# Patient Record
Sex: Male | Born: 1985 | Race: White | Hispanic: No | Marital: Married | Smoking: Current every day smoker
Health system: Southern US, Community
[De-identification: ages and names within clinical notes are randomized; demographics above are authoritative.]

## PROBLEM LIST (undated history)

## (undated) DIAGNOSIS — Z720 Tobacco use: Secondary | ICD-10-CM

## (undated) HISTORY — PX: NO PAST SURGERIES: SHX2092

---

## 2015-08-05 ENCOUNTER — Inpatient Hospital Stay (HOSPITAL_COMMUNITY)
Admission: EM | Admit: 2015-08-05 | Discharge: 2015-08-07 | DRG: 419 | Disposition: A | Discharge status: Home / Self Care | Payer: Medicaid Other | Attending: General Surgery | Admitting: General Surgery

## 2015-08-05 ENCOUNTER — Emergency Department (HOSPITAL_COMMUNITY): Payer: Medicaid Other

## 2015-08-05 ENCOUNTER — Encounter (HOSPITAL_COMMUNITY): Payer: Self-pay

## 2015-08-05 DIAGNOSIS — K8010 Calculus of gallbladder with chronic cholecystitis without obstruction: Secondary | ICD-10-CM | POA: Diagnosis present

## 2015-08-05 DIAGNOSIS — Z720 Tobacco use: Secondary | ICD-10-CM

## 2015-08-05 DIAGNOSIS — F17210 Nicotine dependence, cigarettes, uncomplicated: Secondary | ICD-10-CM | POA: Diagnosis present

## 2015-08-05 DIAGNOSIS — R1011 Right upper quadrant pain: Secondary | ICD-10-CM | POA: Diagnosis present

## 2015-08-05 HISTORY — DX: Tobacco use: Z72.0

## 2015-08-05 LAB — LIPASE, BLOOD: LIPASE: 29 U/L (ref 22–51)

## 2015-08-05 LAB — CBC
HEMATOCRIT: 45.8 % (ref 39.0–52.0)
HEMATOCRIT: 45 % (ref 39.0–52.0)
HEMOGLOBIN: 15.6 g/dL (ref 13.0–17.0)
Hemoglobin: 15.4 g/dL (ref 13.0–17.0)
MCH: 27.8 pg (ref 26.0–34.0)
MCH: 27.8 pg (ref 26.0–34.0)
MCHC: 34.1 g/dL (ref 30.0–36.0)
MCHC: 34.2 g/dL (ref 30.0–36.0)
MCV: 81.6 fL (ref 78.0–100.0)
MCV: 81.2 fL (ref 78.0–100.0)
Platelets: 262 10*3/uL (ref 150–400)
Platelets: 262 10*3/uL (ref 150–400)
RBC: 5.61 MIL/uL (ref 4.22–5.81)
RBC: 5.54 MIL/uL (ref 4.22–5.81)
RDW: 13.9 % (ref 11.5–15.5)
RDW: 13.8 % (ref 11.5–15.5)
WBC: 11.5 10*3/uL — ABNORMAL HIGH (ref 4.0–10.5)
WBC: 13.7 10*3/uL — ABNORMAL HIGH (ref 4.0–10.5)

## 2015-08-05 LAB — COMPREHENSIVE METABOLIC PANEL
ALBUMIN: 4 g/dL (ref 3.5–5.0)
ALK PHOS: 71 U/L (ref 38–126)
ALT: 81 U/L — ABNORMAL HIGH (ref 17–63)
ANION GAP: 9 (ref 5–15)
AST: 35 U/L (ref 15–41)
BUN: 10 mg/dL (ref 6–20)
CALCIUM: 9.4 mg/dL (ref 8.9–10.3)
CHLORIDE: 103 mmol/L (ref 101–111)
CO2: 28 mmol/L (ref 22–32)
Creatinine, Ser: 1.1 mg/dL (ref 0.61–1.24)
GFR calc non Af Amer: >60 mL/min (ref >60)
GLUCOSE: 127 mg/dL — AB (ref 65–99)
POTASSIUM: 3.7 mmol/L (ref 3.5–5.1)
SODIUM: 140 mmol/L (ref 135–145)
Total Bilirubin: 0.3 mg/dL (ref 0.3–1.2)
Total Protein: 7.1 g/dL (ref 6.5–8.1)

## 2015-08-05 LAB — URINALYSIS, ROUTINE W REFLEX MICROSCOPIC
Bilirubin Urine: NEGATIVE
GLUCOSE, UA: NEGATIVE mg/dL
Hgb urine dipstick: NEGATIVE
Ketones, ur: NEGATIVE mg/dL
LEUKOCYTES UA: NEGATIVE
Nitrite: NEGATIVE
PROTEIN: NEGATIVE mg/dL
SPECIFIC GRAVITY, URINE: 1.019 (ref 1.005–1.030)
Urobilinogen, UA: 0.2 mg/dL (ref 0.0–1.0)
pH: 6.5 (ref 5.0–8.0)

## 2015-08-05 LAB — CREATININE, SERUM
Creatinine, Ser: 0.94 mg/dL (ref 0.61–1.24)
GFR calc Af Amer: >60 mL/min (ref >60)

## 2015-08-05 LAB — SURGICAL PCR SCREEN
MRSA, PCR: NEGATIVE
Staphylococcus aureus: NEGATIVE

## 2015-08-05 MED ORDER — ACETAMINOPHEN 325 MG PO TABS: 650.0000 mg | ORAL_TABLET | Freq: Four times a day (QID) | ORAL | Status: DC | PRN

## 2015-08-05 MED ORDER — ONDANSETRON HCL 4 MG/2ML IJ SOLN
4.0000 mg | Freq: Once | INTRAMUSCULAR | Status: AC
  Administered 2015-08-05: 4 mg via INTRAVENOUS
  Filled 2015-08-05: qty 2

## 2015-08-05 MED ORDER — OXYCODONE-ACETAMINOPHEN 5-325 MG PO TABS: 1.0000 | ORAL_TABLET | ORAL | Status: DC | PRN

## 2015-08-05 MED ORDER — HYDROMORPHONE HCL 1 MG/ML IJ SOLN
1.0000 mg | INTRAMUSCULAR | Status: DC | PRN
  Administered 2015-08-06: 1 mg via INTRAVENOUS
  Filled 2015-08-05: qty 1

## 2015-08-05 MED ORDER — ONDANSETRON 4 MG PO TBDP: 4.0000 mg | ORAL_TABLET | Freq: Four times a day (QID) | ORAL | Status: DC | PRN

## 2015-08-05 MED ORDER — HYDROMORPHONE HCL 1 MG/ML IJ SOLN
1.0000 mg | Freq: Once | INTRAMUSCULAR | Status: AC
  Administered 2015-08-05: 1 mg via INTRAVENOUS
  Filled 2015-08-05: qty 1

## 2015-08-05 MED ORDER — PNEUMOCOCCAL VAC POLYVALENT 25 MCG/0.5ML IJ INJ
0.5000 mL | INJECTION | INTRAMUSCULAR | Status: AC
Start: 2015-08-06 — End: 2015-08-07
  Administered 2015-08-07: 0.5 mL via INTRAMUSCULAR

## 2015-08-05 MED ORDER — IBUPROFEN 600 MG PO TABS: 600.0000 mg | ORAL_TABLET | Freq: Four times a day (QID) | ORAL | Status: DC | PRN

## 2015-08-05 MED ORDER — DEXTROSE 5 % IV SOLN
2.0000 g | INTRAVENOUS | Status: DC
  Administered 2015-08-05 – 2015-08-06 (×2): 2 g via INTRAVENOUS
  Filled 2015-08-05 (×3): qty 2

## 2015-08-05 MED ORDER — KCL IN DEXTROSE-NACL 20-5-0.9 MEQ/L-%-% IV SOLN
INTRAVENOUS | Status: DC
  Administered 2015-08-05 – 2015-08-06 (×3): via INTRAVENOUS
  Filled 2015-08-05 (×7): qty 1000

## 2015-08-05 MED ORDER — ACETAMINOPHEN 650 MG RE SUPP: 650.0000 mg | Freq: Four times a day (QID) | RECTAL | Status: DC | PRN

## 2015-08-05 MED ORDER — HEPARIN SODIUM (PORCINE) 5000 UNIT/ML IJ SOLN
5000.0000 [IU] | Freq: Three times a day (TID) | INTRAMUSCULAR | Status: AC
  Administered 2015-08-05 (×2): 5000 [IU] via SUBCUTANEOUS
  Filled 2015-08-05 (×2): qty 1

## 2015-08-05 MED ORDER — NICOTINE 14 MG/24HR TD PT24
14.0000 mg | MEDICATED_PATCH | Freq: Every day | TRANSDERMAL | Status: DC
  Administered 2015-08-05 – 2015-08-06 (×2): 14 mg via TRANSDERMAL
  Filled 2015-08-05 (×3): qty 1

## 2015-08-05 MED ORDER — ONDANSETRON HCL 4 MG/2ML IJ SOLN
4.0000 mg | Freq: Four times a day (QID) | INTRAMUSCULAR | Status: DC | PRN
  Administered 2015-08-06: 4 mg via INTRAVENOUS
  Filled 2015-08-05: qty 2

## 2015-08-05 NOTE — ED Notes (Signed)
Pt remains monitored by blood pressure and pulse ox. Pt encourage to provide urine specimen. Pts family remains at bedside.

## 2015-08-05 NOTE — ED Notes (Signed)
Pt stated he only had a tee shirt and family in room stated they had taken it to the car already. No other belongings

## 2015-08-05 NOTE — Progress Notes (Signed)
Pt admitted to 6N32 via stretcher from ED.  Pt AAO X4.  Pt on RA.  Pt has 20G to Rt AC SL.  Report rcvd from [place], [place].  Family and belongings to bedside.  Pt has no questions at the moment.  Will continue to monitor.

## 2015-08-05 NOTE — ED Notes (Signed)
Intermittent upper right abd pain x 1 year, pain increasingly worse this evening. Pt vomited 2x and complains of shoulder pain as well.

## 2015-08-05 NOTE — ED Notes (Signed)
Pt remains monitored by blood pressure and pulse ox. Pts family remains at bedside.  

## 2015-08-05 NOTE — ED Provider Notes (Signed)
CSN: [id number]     Arrival date & time 08/05/15  0654 History   First MD Initiated Contact with Patient 08/05/15 [phone]     Chief Complaint  Patient presents with  . Abdominal Pain     (Consider location/radiation/quality/duration/timing/severity/associated sxs/prior Treatment) HPI Comments: Patient presents with complaint of right upper quadrant abdominal pain with associated nausea and vomiting. Patient has had this pain intermittently for the past 1 year. He has never had it evaluated. Pain radiates to his right shoulder. The current episode of pain started approximately 2 AM. Symptoms awoke patient from sleep. Patient had a cheese steak sub yesterday afternoon and a deli sub an approximate 12:30 AM. Symptoms were intermittent but became constant at approximately 5 AM. Patient has had 2 episodes of vomiting. No diarrhea. No previous abdominal surgeries. No urinary symptoms. No new or worsening diarrhea or blood in stool. Patient denies heavy NSAID or alcohol use. He is a smoker. The onset of this condition was acute. The course is constant. Aggravating factors: none. Alleviating factors: none.    Patient is a 29 y.o. male presenting with abdominal pain. The history is provided by the patient.  Abdominal Pain Associated symptoms: nausea and vomiting   Associated symptoms: no chest pain, no cough, no diarrhea, no dysuria, no fever and no sore throat     No past medical history on file. No past surgical history on file. No family history on file. Social History  Substance Use Topics  . Smoking status: Current Every Day Smoker -- 1.00 packs/day    Types: Cigarettes  . Smokeless tobacco: Never Used  . Alcohol Use: No    Review of Systems  Constitutional: Negative for fever.  HENT: Negative for rhinorrhea and sore throat.   Eyes: Negative for redness.  Respiratory: Negative for cough.   Cardiovascular: Negative for chest pain.  Gastrointestinal: Positive for nausea, vomiting and  abdominal pain. Negative for diarrhea and blood in stool.  Genitourinary: Negative for dysuria.  Musculoskeletal: Negative for myalgias.  Skin: Negative for rash.  Neurological: Negative for headaches.      Allergies  Review of patient's allergies indicates no known allergies.  Home Medications   Prior to Admission medications   Not on File   BP 162/76 mmHg  Pulse 82  Temp(Src) 98.3 F (36.8 C) (Oral)  Resp 20  SpO2 99%   Physical Exam  Constitutional: He appears well-developed and well-nourished.  HENT:  Head: Normocephalic and atraumatic.  Eyes: Conjunctivae are normal. Right eye exhibits no discharge. Left eye exhibits no discharge.  Neck: Normal range of motion. Neck supple.  Cardiovascular: Normal rate, regular rhythm and normal heart sounds.   Pulmonary/Chest: Effort normal and breath sounds normal.  Abdominal: Soft. Bowel sounds are normal. There is tenderness in the right upper quadrant. There is positive Murphy's sign. There is no rigidity, no rebound, no guarding and no tenderness at McBurney's point.  Neurological: He is alert.  Skin: Skin is warm and dry.  Psychiatric: He has a normal mood and affect.  Nursing note and vitals reviewed.   ED Course  Procedures (including critical care time) Labs Review Labs Reviewed  COMPREHENSIVE METABOLIC PANEL - Abnormal; Notable for the following:    Glucose, Bld 127 (*)    ALT 81 (*)    All other components within normal limits  CBC - Abnormal; Notable for the following:    WBC 11.5 (*)    All other components within normal limits  CBC - Abnormal; Notable for  the following:    WBC 13.7 (*)    All other components within normal limits  LIPASE, BLOOD  URINALYSIS, ROUTINE W REFLEX MICROSCOPIC (NOT AT [facility])  CREATININE, SERUM    Imaging Review US Abdomen Complete  08/05/2015   CLINICAL DATA:  29 year old male with right abdominal pain for 1 day.  EXAM: ULTRASOUND ABDOMEN COMPLETE  COMPARISON:  None.  FINDINGS:  Gallbladder: A 2 cm non mobile gallstone at the gallbladder neck is identified. Gallbladder wall is upper limits of normal measuring up to 3 mm in diameter. There is no evidence of pericholecystic fluid or sonographic Murphy sign.  Common bile duct: Diameter: 5.8 mm. There is no evidence of intrahepatic or extrahepatic biliary dilatation.  Liver: Increased echogenicity of the liver is compatible with hepatic steatosis. No other hepatic abnormalities are identified.  IVC: No abnormality visualized.  Pancreas: Visualized portion unremarkable.  Spleen: Size and appearance within normal limits.  Right Kidney: Length: 12.9 cm. Echogenicity within normal limits. No mass or hydronephrosis visualized.  Left Kidney: Length: 12.8 cm. Echogenicity within normal limits. No mass or hydronephrosis visualized.  Abdominal aorta: No aneurysm visualized.  Other findings: No free fluid.  IMPRESSION: 2 cm nonmobile gallstone at the gallbladder neck with upper limits of normal gallbladder wall thickness which may represent early/developing acute cholecystitis. However, no evidence of pericholecystic fluid or sonographic Murphy sign.  Hepatic steatosis.   Electronically Signed   By: Harmon Pier M.D.   On: 08/05/2015 11:20   I have personally reviewed and evaluated these images and lab results as part of my medical decision-making.   EKG Interpretation None      7:31 AM Patient seen and examined. Work-up initiated. Medications ordered.   Vital signs reviewed and are as follows: BP 162/76 mmHg  Pulse 82  Temp(Src) 98.3 F (36.8 C) (Oral)  Resp 20  SpO2 99%   11:31 AM [place] completed. Findings as above. Pain was improved, now returning. Will consult surgery.   Surgery has seen, they will admit.   MDM   Final diagnosis: cholithiasis  Admit.    Renne Crigler, PA-C 08/05/15 1401  Alvira Monday, MD 08/06/15 [phone]

## 2015-08-05 NOTE — ED Notes (Signed)
Patient transported to Ultrasound 

## 2015-08-05 NOTE — H&P (Signed)
Garrett Serrano is an 29 y.o. male.   Chief Complaint: Pain RUQ going to shoulder; with nausea and vomiting  HPI: 56 y/0 male with episodes of pain RUQ, on and off for about a year.  Pain goes to right shoulder.  He has steak and cheese last PM, and woke up with pain around 2 AM and 2 episodes of nausea and vomiting. He had pain till about 8 AM.  He has had multiple prior episodes, but they only last about 2 hours.  This has lasted longer than the others, he did show some improvement earlier this AM, but pain returned. He presented to the ED early this AM.   He was afebrile, BP/HR are normal.  Labs show slight bump in ALT 81, lipase is normal, WBC 11.5.  Abdominal ultrasound shows:  2 cm nonmobile gallstone at the gallbladder neck with upper limits of normal gallbladder wall thickness which may represent early/developing acute cholecystitis. However, no evidence of pericholecystic fluid or sonographic Murphy sign.  We are ask to see.  Past Medical History  Diagnosis Date  . Tobacco use     No past surgical history on file.  No prior surgery  No family history on file. Social History:  reports that he has been smoking Cigarettes.  He has been smoking about 1.00 pack per day. He has never used smokeless tobacco. He reports that he does not drink alcohol or use illicit drugs.  Tobacco:  1 PPD ETOH:  None DRUGS:  none Married  Works at [employer].  Allergies: No Known Allergies  Prior to Admission medications   None      Results for orders placed or performed during the hospital encounter of 08/05/15 (from the past 48 hour(s))  Lipase, blood     Status: None   Collection Time: 08/05/15  7:10 AM  Result Value Ref Range   Lipase 29 22 - 51 U/L  Comprehensive metabolic panel     Status: Abnormal   Collection Time: 08/05/15  7:10 AM  Result Value Ref Range   Sodium 140 135 - 145 mmol/L   Potassium 3.7 3.5 - 5.1 mmol/L   Chloride 103 101 - 111 mmol/L   CO2 28 22 - 32 mmol/L   Glucose, Bld 127 (H) 65 - 99 mg/dL   BUN 10 6 - 20 mg/dL   Creatinine, Ser 1.10 0.61 - 1.24 mg/dL   Calcium 9.4 8.9 - 10.3 mg/dL   Total Protein 7.1 6.5 - 8.1 g/dL   Albumin 4.0 3.5 - 5.0 g/dL   AST 35 15 - 41 U/L   ALT 81 (H) 17 - 63 U/L   Alkaline Phosphatase 71 38 - 126 U/L   Total Bilirubin 0.3 0.3 - 1.2 mg/dL   GFR calc non Af Amer >60 >60 mL/min   GFR calc Af Amer >60 >60 mL/min    Comment: (NOTE) The eGFR has been calculated using the CKD EPI equation. This calculation has not been validated in all clinical situations. eGFR's persistently <60 mL/min signify possible Chronic Kidney Disease.    Anion gap 9 5 - 15  CBC     Status: Abnormal   Collection Time: 08/05/15  7:10 AM  Result Value Ref Range   WBC 11.5 (H) 4.0 - 10.5 K/uL   RBC 5.61 4.22 - 5.81 MIL/uL   Hemoglobin 15.6 13.0 - 17.0 g/dL   HCT 45.8 39.0 - 52.0 %   MCV 81.6 78.0 - 100.0 fL   MCH 27.8  26.0 - 34.0 pg   MCHC 34.1 30.0 - 36.0 g/dL   RDW 13.9 11.5 - 15.5 %   Platelets 262 150 - 400 K/uL  Urinalysis, Routine w reflex microscopic (not at [facility])     Status: None   Collection Time: 08/05/15  8:05 AM  Result Value Ref Range   Color, Urine YELLOW YELLOW   APPearance CLEAR CLEAR   Specific Gravity, Urine 1.019 1.005 - 1.030   pH 6.5 5.0 - 8.0   Glucose, UA NEGATIVE NEGATIVE mg/dL   Hgb urine dipstick NEGATIVE NEGATIVE   Bilirubin Urine NEGATIVE NEGATIVE   Ketones, ur NEGATIVE NEGATIVE mg/dL   Protein, ur NEGATIVE NEGATIVE mg/dL   Urobilinogen, UA 0.2 0.0 - 1.0 mg/dL   Nitrite NEGATIVE NEGATIVE   Leukocytes, UA NEGATIVE NEGATIVE    Comment: MICROSCOPIC NOT DONE ON URINES WITH NEGATIVE PROTEIN, BLOOD, LEUKOCYTES, NITRITE, OR GLUCOSE <1000 mg/dL.   US Abdomen Complete  08/05/2015   CLINICAL DATA:  29 year old male with right abdominal pain for 1 day.  EXAM: ULTRASOUND ABDOMEN COMPLETE  COMPARISON:  None.  FINDINGS: Gallbladder: A 2 cm non mobile gallstone at the gallbladder neck is identified. Gallbladder  wall is upper limits of normal measuring up to 3 mm in diameter. There is no evidence of pericholecystic fluid or sonographic Murphy sign.  Common bile duct: Diameter: 5.8 mm. There is no evidence of intrahepatic or extrahepatic biliary dilatation.  Liver: Increased echogenicity of the liver is compatible with hepatic steatosis. No other hepatic abnormalities are identified.  IVC: No abnormality visualized.  Pancreas: Visualized portion unremarkable.  Spleen: Size and appearance within normal limits.  Right Kidney: Length: 12.9 cm. Echogenicity within normal limits. No mass or hydronephrosis visualized.  Left Kidney: Length: 12.8 cm. Echogenicity within normal limits. No mass or hydronephrosis visualized.  Abdominal aorta: No aneurysm visualized.  Other findings: No free fluid.  IMPRESSION: 2 cm nonmobile gallstone at the gallbladder neck with upper limits of normal gallbladder wall thickness which may represent early/developing acute cholecystitis. However, no evidence of pericholecystic fluid or sonographic Murphy sign.  Hepatic steatosis.   Electronically Signed   By: Margarette Canada M.D.   On: 08/05/2015 11:20    Review of Systems  Constitutional: Negative.   HENT: Negative.   Eyes: Negative.   Respiratory: Negative.   Cardiovascular: Negative.   Gastrointestinal: Positive for nausea, vomiting and abdominal pain. Negative for heartburn, diarrhea, constipation and blood in stool.  Genitourinary: Negative.   Musculoskeletal: Negative.   Skin: Negative.   Neurological: Negative.   Endo/Heme/Allergies: Negative.   Psychiatric/Behavioral: Negative.     Blood pressure 129/76, pulse 61, temperature 98.3 F (36.8 C), temperature source Oral, resp. rate 16, SpO2 97 %. Physical Exam  Constitutional: He is oriented to person, place, and time. He appears well-developed and well-nourished. No distress.  HENT:  Head: Normocephalic and atraumatic.  Nose: Nose normal.  Eyes: Conjunctivae and EOM are normal.  Right eye exhibits no discharge. Left eye exhibits no discharge. No scleral icterus.  Neck: Normal range of motion. Neck supple. No JVD present. No tracheal deviation present. No thyromegaly present.  Cardiovascular: Normal rate, regular rhythm, normal heart sounds and intact distal pulses.   No murmur heard. Respiratory: Effort normal and breath sounds normal. No respiratory distress. He has no wheezes. He has no rales. He exhibits no tenderness.  GI: Soft. Bowel sounds are normal. He exhibits no distension and no mass. There is no tenderness. There is no rebound and no guarding.  Musculoskeletal: He exhibits no edema or tenderness.  Lymphadenopathy:    He has no cervical adenopathy.  Neurological: He is alert and oriented to person, place, and time. No cranial nerve deficit.  Skin: Skin is warm and dry. No rash noted. He is not diaphoretic. No erythema. No pallor.  Psychiatric: He has a normal mood and affect. His behavior is normal. Judgment and thought content normal.     Assessment/Plan Cholelithiasis with cholecystitis Tobacco use  Plan:  Admit, clear liquids today, surgery in AM.  Hydrate and antibiotics now.  He is currently pain free.  Victory Dresden 08/05/2015, 11:55 AM

## 2015-08-06 ENCOUNTER — Inpatient Hospital Stay (HOSPITAL_COMMUNITY): Payer: Medicaid Other

## 2015-08-06 ENCOUNTER — Inpatient Hospital Stay (HOSPITAL_COMMUNITY): Payer: Medicaid Other | Admitting: Anesthesiology

## 2015-08-06 ENCOUNTER — Encounter (HOSPITAL_COMMUNITY): Admission: EM | Disposition: A | Discharge status: Home / Self Care | Payer: Self-pay | Source: Home / Self Care

## 2015-08-06 ENCOUNTER — Encounter (HOSPITAL_COMMUNITY): Payer: Self-pay | Admitting: Anesthesiology

## 2015-08-06 HISTORY — PX: CHOLECYSTECTOMY: SHX55

## 2015-08-06 LAB — COMPREHENSIVE METABOLIC PANEL
ALK PHOS: 63 U/L (ref 38–126)
ALT: 71 U/L — AB (ref 17–63)
AST: 34 U/L (ref 15–41)
Albumin: 3.5 g/dL (ref 3.5–5.0)
Anion gap: 7 (ref 5–15)
CALCIUM: 8.8 mg/dL — AB (ref 8.9–10.3)
CHLORIDE: 106 mmol/L (ref 101–111)
CO2: 26 mmol/L (ref 22–32)
CREATININE: 0.97 mg/dL (ref 0.61–1.24)
Glucose, Bld: 103 mg/dL — ABNORMAL HIGH (ref 65–99)
Potassium: 4.2 mmol/L (ref 3.5–5.1)
Sodium: 139 mmol/L (ref 135–145)
Total Bilirubin: 0.5 mg/dL (ref 0.3–1.2)
Total Protein: 6.3 g/dL — ABNORMAL LOW (ref 6.5–8.1)

## 2015-08-06 LAB — CBC
HCT: 43.6 % (ref 39.0–52.0)
Hemoglobin: 14.7 g/dL (ref 13.0–17.0)
MCH: 27.7 pg (ref 26.0–34.0)
MCHC: 33.7 g/dL (ref 30.0–36.0)
MCV: 82.1 fL (ref 78.0–100.0)
PLATELETS: 234 10*3/uL (ref 150–400)
RBC: 5.31 MIL/uL (ref 4.22–5.81)
RDW: 14 % (ref 11.5–15.5)
WBC: 8.6 10*3/uL (ref 4.0–10.5)

## 2015-08-06 LAB — LIPASE, BLOOD: LIPASE: 21 U/L — AB (ref 22–51)

## 2015-08-06 SURGERY — LAPAROSCOPIC CHOLECYSTECTOMY WITH INTRAOPERATIVE CHOLANGIOGRAM
Anesthesia: General

## 2015-08-06 MED ORDER — LIDOCAINE HCL (CARDIAC) 20 MG/ML IV SOLN
INTRAVENOUS | Status: DC | PRN
  Administered 2015-08-06: 60 mg via INTRAVENOUS

## 2015-08-06 MED ORDER — NEOSTIGMINE METHYLSULFATE 10 MG/10ML IV SOLN
INTRAVENOUS | Status: DC | PRN
  Administered 2015-08-06: 4 mg via INTRAVENOUS

## 2015-08-06 MED ORDER — PROPOFOL 10 MG/ML IV BOLUS
INTRAVENOUS | Status: DC | PRN
  Administered 2015-08-06: 150 mg via INTRAVENOUS

## 2015-08-06 MED ORDER — ALUM & MAG HYDROXIDE-SIMETH 200-200-20 MG/5ML PO SUSP
30.0000 mL | ORAL | Status: DC | PRN
  Administered 2015-08-06 – 2015-08-07 (×2): 30 mL via ORAL
  Filled 2015-08-06 (×2): qty 30

## 2015-08-06 MED ORDER — LIDOCAINE HCL (CARDIAC) 20 MG/ML IV SOLN
INTRAVENOUS | Status: AC
  Filled 2015-08-06: qty 5

## 2015-08-06 MED ORDER — LACTATED RINGERS IV SOLN
INTRAVENOUS | Status: DC
  Administered 2015-08-06: 11:00:00 via INTRAVENOUS

## 2015-08-06 MED ORDER — DEXAMETHASONE SODIUM PHOSPHATE 4 MG/ML IJ SOLN
INTRAMUSCULAR | Status: DC | PRN
  Administered 2015-08-06: 8 mg via INTRAVENOUS

## 2015-08-06 MED ORDER — ROCURONIUM BROMIDE 100 MG/10ML IV SOLN
INTRAVENOUS | Status: DC | PRN
  Administered 2015-08-06: 40 mg via INTRAVENOUS
  Administered 2015-08-06: 10 mg via INTRAVENOUS

## 2015-08-06 MED ORDER — MIDAZOLAM HCL 5 MG/5ML IJ SOLN
INTRAMUSCULAR | Status: DC | PRN
  Administered 2015-08-06: 2 mg via INTRAVENOUS

## 2015-08-06 MED ORDER — ROCURONIUM BROMIDE 50 MG/5ML IV SOLN
INTRAVENOUS | Status: AC
  Filled 2015-08-06: qty 1

## 2015-08-06 MED ORDER — OXYCODONE-ACETAMINOPHEN 5-325 MG PO TABS
1.0000 | ORAL_TABLET | ORAL | Status: DC | PRN
  Administered 2015-08-06 – 2015-08-07 (×3): 2 via ORAL
  Filled 2015-08-06 (×3): qty 2

## 2015-08-06 MED ORDER — FENTANYL CITRATE (PF) 100 MCG/2ML IJ SOLN
INTRAMUSCULAR | Status: DC | PRN
  Administered 2015-08-06: 100 ug via INTRAVENOUS
  Administered 2015-08-06 (×3): 50 ug via INTRAVENOUS

## 2015-08-06 MED ORDER — GLYCOPYRROLATE 0.2 MG/ML IJ SOLN
INTRAMUSCULAR | Status: DC | PRN
  Administered 2015-08-06: 0.6 mg via INTRAVENOUS

## 2015-08-06 MED ORDER — SODIUM CHLORIDE 0.9 % IR SOLN
Status: DC | PRN
  Administered 2015-08-06: 1000 mL

## 2015-08-06 MED ORDER — HYDROMORPHONE HCL 1 MG/ML IJ SOLN
INTRAMUSCULAR | Status: AC
  Filled 2015-08-06: qty 1

## 2015-08-06 MED ORDER — BUPIVACAINE-EPINEPHRINE 0.25% -1:200000 IJ SOLN
INTRAMUSCULAR | Status: DC | PRN
  Administered 2015-08-06: 18 mL

## 2015-08-06 MED ORDER — ONDANSETRON HCL 4 MG/2ML IJ SOLN
INTRAMUSCULAR | Status: DC | PRN
  Administered 2015-08-06: 4 mg via INTRAVENOUS

## 2015-08-06 MED ORDER — ONDANSETRON HCL 4 MG/2ML IJ SOLN
INTRAMUSCULAR | Status: AC
  Filled 2015-08-06: qty 2

## 2015-08-06 MED ORDER — PROPOFOL 10 MG/ML IV BOLUS
INTRAVENOUS | Status: AC
  Filled 2015-08-06: qty 20

## 2015-08-06 MED ORDER — SODIUM CHLORIDE 0.9 % IV SOLN
INTRAVENOUS | Status: DC | PRN
  Administered 2015-08-06: 12 mL

## 2015-08-06 MED ORDER — BUPIVACAINE-EPINEPHRINE (PF) 0.25% -1:200000 IJ SOLN
INTRAMUSCULAR | Status: AC
  Filled 2015-08-06: qty 30

## 2015-08-06 MED ORDER — LACTATED RINGERS IV SOLN: INTRAVENOUS | Status: DC

## 2015-08-06 MED ORDER — LACTATED RINGERS IV SOLN
INTRAVENOUS | Status: DC | PRN
  Administered 2015-08-06: 12:00:00 via INTRAVENOUS

## 2015-08-06 MED ORDER — FENTANYL CITRATE (PF) 250 MCG/5ML IJ SOLN
INTRAMUSCULAR | Status: AC
  Filled 2015-08-06: qty 5

## 2015-08-06 MED ORDER — GLYCOPYRROLATE 0.2 MG/ML IJ SOLN
INTRAMUSCULAR | Status: AC
  Filled 2015-08-06: qty 3

## 2015-08-06 MED ORDER — HYDROMORPHONE HCL 1 MG/ML IJ SOLN
0.2500 mg | INTRAMUSCULAR | Status: DC | PRN
  Administered 2015-08-06 (×2): 0.5 mg via INTRAVENOUS

## 2015-08-06 MED ORDER — MIDAZOLAM HCL 2 MG/2ML IJ SOLN
INTRAMUSCULAR | Status: AC
  Filled 2015-08-06: qty 4

## 2015-08-06 MED ORDER — 0.9 % SODIUM CHLORIDE (POUR BTL) OPTIME
TOPICAL | Status: DC | PRN
  Administered 2015-08-06: 1000 mL

## 2015-08-06 SURGICAL SUPPLY — 33 items
APPLIER CLIP 5 13 M/L LIGAMAX5 (MISCELLANEOUS) ×3
BLADE SURG ROTATE 9660 (MISCELLANEOUS) ×3 IMPLANT
CANISTER SUCTION 2500CC (MISCELLANEOUS) ×3 IMPLANT
CATH REDDICK CHOLANGI 4FR 50CM (CATHETERS) ×3 IMPLANT
CHLORAPREP W/TINT 26ML (MISCELLANEOUS) ×3 IMPLANT
CLIP APPLIE 5 13 M/L LIGAMAX5 (MISCELLANEOUS) ×1 IMPLANT
COVER MAYO STAND STRL (DRAPES) ×3 IMPLANT
COVER SURGICAL LIGHT HANDLE (MISCELLANEOUS) ×3 IMPLANT
DRAPE C-ARM 42X72 X-RAY (DRAPES) ×3 IMPLANT
ELECT REM PT RETURN 9FT ADLT (ELECTROSURGICAL) ×3
ELECTRODE REM PT RTRN 9FT ADLT (ELECTROSURGICAL) ×1 IMPLANT
GLOVE BIO SURGEON STRL SZ7.5 (GLOVE) ×3 IMPLANT
GLOVE SURG SS PI 8.0 STRL IVOR (GLOVE) ×3 IMPLANT
GOWN STRL REUS W/ TWL LRG LVL3 (GOWN DISPOSABLE) ×3 IMPLANT
GOWN STRL REUS W/TWL LRG LVL3 (GOWN DISPOSABLE) ×6
IV CATH 14GX2 1/4 (CATHETERS) ×3 IMPLANT
KIT BASIN OR (CUSTOM PROCEDURE TRAY) ×3 IMPLANT
KIT ROOM TURNOVER OR (KITS) ×3 IMPLANT
LIQUID BAND (GAUZE/BANDAGES/DRESSINGS) ×3 IMPLANT
NS IRRIG 1000ML POUR BTL (IV SOLUTION) ×3 IMPLANT
PAD ARMBOARD 7.5X6 YLW CONV (MISCELLANEOUS) ×3 IMPLANT
POUCH SPECIMEN RETRIEVAL 10MM (ENDOMECHANICALS) ×3 IMPLANT
SCISSORS LAP 5X35 DISP (ENDOMECHANICALS) ×3 IMPLANT
SET IRRIG TUBING LAPAROSCOPIC (IRRIGATION / IRRIGATOR) ×3 IMPLANT
SLEEVE ENDOPATH XCEL 5M (ENDOMECHANICALS) ×6 IMPLANT
SPECIMEN JAR SMALL (MISCELLANEOUS) ×3 IMPLANT
SUT MNCRL AB 4-0 PS2 18 (SUTURE) ×6 IMPLANT
TOWEL OR 17X24 6PK STRL BLUE (TOWEL DISPOSABLE) IMPLANT
TOWEL OR 17X26 10 PK STRL BLUE (TOWEL DISPOSABLE) ×3 IMPLANT
TRAY LAPAROSCOPIC MC (CUSTOM PROCEDURE TRAY) ×3 IMPLANT
TROCAR XCEL BLUNT TIP 100MML (ENDOMECHANICALS) ×3 IMPLANT
TROCAR XCEL NON-BLD 5MMX100MML (ENDOMECHANICALS) ×3 IMPLANT
TUBING INSUFFLATION (TUBING) ×3 IMPLANT

## 2015-08-06 NOTE — Progress Notes (Signed)
  Subjective: Doing well this AM.  Still having some pain RUQ.  No nausea or vomiting.  Objective: Vital signs in last 24 hours: Temp:  [97.7 F (36.5 C)-98.3 F (36.8 C)] 97.7 F (36.5 C) (08/26 1610) Pulse Rate:  [60-90] 69 (08/26 0608) Resp:  [16-19] 19 (08/26 0608) BP: (109-142)/(61-86) 122/61 mmHg (08/26 0608) SpO2:  [93 %-99 %] 96 % (08/26 0608) Weight:  [106.142 kg (234 lb)] 106.142 kg (234 lb) (08/25 1857) Last BM Date: 08/05/15 Afebrile, VVV Labs OK Intake/Output from previous day: 08/25 0701 - 08/26 0700 In: 1730 [P.O.:480; I.V.:1200; IV Piggyback:50] Out: 850 [Urine:850] Intake/Output this shift:    General appearance: alert, cooperative and no distress GI: soft, tender RUQ, normal BS  Lab Results:   Recent Labs  08/05/15 1301 08/06/15 0316  WBC 13.7* 8.6  HGB 15.4 14.7  HCT 45.0 43.6  PLT 262 234    BMET  Recent Labs  08/05/15 0710 08/05/15 1301 08/06/15 0316  NA 140  --  139  K 3.7  --  4.2  CL 103  --  106  CO2 28  --  26  GLUCOSE 127*  --  103*  BUN 10  --  <5*  CREATININE 1.10 0.94 0.97  CALCIUM 9.4  --  8.8*   PT/INR No results for input(s): LABPROT, INR in the last 72 hours.   Recent Labs Lab 08/05/15 0710 08/06/15 0316  AST 35 34  ALT 81* 71*  ALKPHOS 71 63  BILITOT 0.3 0.5  PROT 7.1 6.3*  ALBUMIN 4.0 3.5     Lipase     Component Value Date/Time   LIPASE 21* 08/06/2015 0316     Studies/Results: US Abdomen Complete  08/05/2015   CLINICAL DATA:  29 year old male with right abdominal pain for 1 day.  EXAM: ULTRASOUND ABDOMEN COMPLETE  COMPARISON:  None.  FINDINGS: Gallbladder: A 2 cm non mobile gallstone at the gallbladder neck is identified. Gallbladder wall is upper limits of normal measuring up to 3 mm in diameter. There is no evidence of pericholecystic fluid or sonographic Murphy sign.  Common bile duct: Diameter: 5.8 mm. There is no evidence of intrahepatic or extrahepatic biliary dilatation.  Liver: Increased  echogenicity of the liver is compatible with hepatic steatosis. No other hepatic abnormalities are identified.  IVC: No abnormality visualized.  Pancreas: Visualized portion unremarkable.  Spleen: Size and appearance within normal limits.  Right Kidney: Length: 12.9 cm. Echogenicity within normal limits. No mass or hydronephrosis visualized.  Left Kidney: Length: 12.8 cm. Echogenicity within normal limits. No mass or hydronephrosis visualized.  Abdominal aorta: No aneurysm visualized.  Other findings: No free fluid.  IMPRESSION: 2 cm nonmobile gallstone at the gallbladder neck with upper limits of normal gallbladder wall thickness which may represent early/developing acute cholecystitis. However, no evidence of pericholecystic fluid or sonographic Murphy sign.  Hepatic steatosis.   Electronically Signed   By: Harmon Pier M.D.   On: 08/05/2015 11:20    Medications: . cefTRIAXone (ROCEPHIN)  IV  2 g Intravenous Q24H  . nicotine  14 mg Transdermal Daily  . pneumococcal 23 valent vaccine  0.5 mL Intramuscular Tomorrow-1000    Assessment/Plan Cholelithiasis with cholecystitis Tobacco use  Antibiotics:  Day 2 Rocephin DVT:  SCD/ Hepain (last dose 2200)  Plan:  OR today.   LOS: 1 day    Rontavious Albright 08/06/2015

## 2015-08-06 NOTE — Anesthesia Procedure Notes (Signed)
Procedure Name: Intubation Date/Time: 08/06/2015 12:51 PM Performed by: O'LAUGHLIN, KAREN H Pre-anesthesia Checklist: Patient identified, Timeout performed, Emergency Drugs available, Suction available and Patient being monitored Patient Re-evaluated:Patient Re-evaluated prior to inductionOxygen Delivery Method: Circle system utilized Preoxygenation: Pre-oxygenation with 100% oxygen Intubation Type: IV induction Ventilation: Two handed mask ventilation required, Oral airway inserted - appropriate to patient size and Mask ventilation without difficulty Laryngoscope Size: Mac and 4 Grade View: Grade II Tube type: Oral Tube size: 7.5 mm Number of attempts: 1 Airway Equipment and Method: Stylet,  Oral airway and LTA kit utilized Placement Confirmation: ETT inserted through vocal cords under direct vision,  positive ETCO2 and breath sounds checked- equal and bilateral Secured at: 23 cm Tube secured with: Tape Dental Injury: Teeth and Oropharynx as per pre-operative assessment      

## 2015-08-06 NOTE — Transfer of Care (Signed)
Immediate Anesthesia Transfer of Care Note  Patient: Garrett Serrano  Procedure(s) Performed: Procedure(s): LAPAROSCOPIC CHOLECYSTECTOMY WITH INTRAOPERATIVE CHOLANGIOGRAM (N/A)  Patient Location: PACU  Anesthesia Type:General  Level of Consciousness: awake, alert  and oriented  Airway & Oxygen Therapy: Patient Spontanous Breathing and Patient connected to nasal cannula oxygen  Post-op Assessment: Report given to RN and Post -op Vital signs reviewed and stable  Post vital signs: Reviewed and stable  Last Vitals:  Filed Vitals:   08/06/15 1026  BP: 118/71  Pulse: 58  Temp: 36.6 C  Resp: 20    Complications: No apparent anesthesia complications

## 2015-08-06 NOTE — Anesthesia Preprocedure Evaluation (Signed)
Anesthesia Evaluation  Patient identified by MRN, date of birth, ID band Patient awake    Reviewed: Allergy & Precautions, H&P , NPO status , Patient's Chart, lab work & pertinent test results  Airway Mallampati: II  TM Distance: >3 FB Neck ROM: Full    Dental no notable dental hx. (+) Poor Dentition, Dental Advisory Given   Pulmonary Current Smoker,  breath sounds clear to auscultation  Pulmonary exam normal       Cardiovascular negative cardio ROS  Rhythm:Regular Rate:Normal     Neuro/Psych negative neurological ROS  negative psych ROS   GI/Hepatic negative GI ROS, Neg liver ROS,   Endo/Other  negative endocrine ROS  Renal/GU negative Renal ROS  negative genitourinary   Musculoskeletal   Abdominal   Peds  Hematology negative hematology ROS (+)   Anesthesia Other Findings   Reproductive/Obstetrics negative OB ROS                            Anesthesia Physical Anesthesia Plan  ASA: II  Anesthesia Plan: General   Post-op Pain Management:    Induction: Intravenous  Airway Management Planned: Oral ETT  Additional Equipment:   Intra-op Plan:   Post-operative Plan: Extubation in OR  Informed Consent: I have reviewed the patients History and Physical, chart, labs and discussed the procedure including the risks, benefits and alternatives for the proposed anesthesia with the patient or authorized representative who has indicated his/her understanding and acceptance.   Dental advisory given  Plan Discussed with: CRNA  Anesthesia Plan Comments:         Anesthesia Quick Evaluation  

## 2015-08-06 NOTE — Op Note (Signed)
08/05/2015 - 08/06/2015  1:37 PM  PATIENT:  Garrett Serrano  29 y.o. male  PRE-OPERATIVE DIAGNOSIS:  GALLSTONES  POST-OPERATIVE DIAGNOSIS:  GALLSTONES  PROCEDURE:  Procedure(s): LAPAROSCOPIC CHOLECYSTECTOMY WITH INTRAOPERATIVE CHOLANGIOGRAM (N/A)  SURGEON:  Surgeon(s) and Role:    * Griselda Miner, MD - Primary  PHYSICIAN ASSISTANT:   ASSISTANTS: Barnetta Chapel, PA   ANESTHESIA:   general  EBL:     BLOOD ADMINISTERED:none  DRAINS: none   LOCAL MEDICATIONS USED:  MARCAINE     SPECIMEN:  Source of Specimen:  gallbladder  DISPOSITION OF SPECIMEN:  PATHOLOGY  COUNTS:  YES  TOURNIQUET:  * No tourniquets in log *  DICTATION: .Dragon Dictation   Procedure: After informed consent was obtained the patient was brought to the operating room and placed in the supine position on the operating room table. After adequate induction of general anesthesia the patient's abdomen was prepped with ChloraPrep allowed to dry and draped in usual sterile manner. The area below the umbilicus was infiltrated with quarter percent  Marcaine. A small incision was made with a 15 blade knife. The incision was carried down through the subcutaneous tissue bluntly with a hemostat and [employer] retractors. The linea alba was identified. The linea alba was incised with a 15 blade knife and each side was grasped with Coker clamps. The preperitoneal space was then probed with a hemostat until the peritoneum was opened and access was gained to the abdominal cavity. A 0 Vicryl pursestring stitch was placed in the fascia surrounding the opening. A Hassan cannula was then placed through the opening and anchored in place with the previously placed Vicryl purse string stitch. The abdomen was insufflated with carbon dioxide without difficulty. A laparoscope was inserted through the [facility] cannula in the right upper quadrant was inspected. Next the epigastric region was infiltrated with % Marcaine. A small incision was made  with a 15 blade knife. A 5 mm port was placed bluntly through this incision into the abdominal cavity under direct vision. Next 2 sites were chosen laterally on the right side of the abdomen for placement of 5 mm ports. Each of these areas was infiltrated with quarter percent Marcaine. Small stab incisions were made with a 15 blade knife. 5 mm ports were then placed bluntly through these incisions into the abdominal cavity under direct vision without difficulty. A blunt grasper was placed through the lateralmost 5 mm port and used to grasp the dome of the gallbladder and elevated anteriorly and superiorly. Another blunt grasper was placed through the other 5 mm port and used to retract the body and neck of the gallbladder. A dissector was placed through the epigastric port and using the electrocautery the peritoneal reflection at the gallbladder neck was opened. Blunt dissection was then carried out in this area until the gallbladder neck-cystic duct junction was readily identified and a good window was created. A single clip was placed on the gallbladder neck. A small  ductotomy was made just below the clip with laparoscopic scissors. A 14-gauge Angiocath was then placed through the anterior abdominal wall under direct vision. A Reddick cholangiogram catheter was then placed through the Angiocath and flushed. The catheter was then placed in the cystic duct and anchored in place with a clip. A cholangiogram was obtained that showed no filling defects good emptying into the duodenum an adequate length on the cystic duct. The anchoring clip and catheters were then removed from the patient. 2 clips were placed proximally on the cystic duct  and the duct was divided between the 2 sets of clips. Posterior to this the cystic artery was identified and again dissected bluntly in a circumferential manner until a good window  was created. 2 clips were placed proximally and one distally on the artery and the artery was divided  between the 2 sets of clips. Next a laparoscopic hook cautery device was used to separate the gallbladder from the liver bed. Prior to completely detaching the gallbladder from the liver bed the liver bed was inspected and several small bleeding points were coagulated with the electrocautery until the area was completely hemostatic. The gallbladder was then detached the rest of it from the liver bed without difficulty. A laparoscopic bag was inserted through the hassan port. The laparoscope was moved to the epigastric port. The gallbladder was placed within the bag and the bag was sealed.  The bag with the gallbladder was then removed with the [facility] cannula through the infraumbilical port without difficulty. The fascial defect was then closed with the previously placed Vicryl pursestring stitch as well as with another figure-of-eight 0 Vicryl stitch. The liver bed was inspected again and found to be hemostatic. The abdomen was irrigated with copious amounts of saline until the effluent was clear. The ports were then removed under direct vision without difficulty and were found to be hemostatic. The gas was allowed to escape. The skin incisions were all closed with interrupted 4-0 Monocryl subcuticular stitches. Dermabond dressings were applied. The patient tolerated the procedure well. At the end of the case all needle sponge and instrument counts were correct. The patient was then awakened and taken to recovery in stable condition  PLAN OF CARE: Admit to inpatient   PATIENT DISPOSITION:  PACU - hemodynamically stable.   Delay start of Pharmacological VTE agent (>24hrs) due to surgical blood loss or risk of bleeding: no

## 2015-08-06 NOTE — Anesthesia Postprocedure Evaluation (Signed)
  Anesthesia Post-op Note  Patient: Garrett Serrano  Procedure(s) Performed: Procedure(s): LAPAROSCOPIC CHOLECYSTECTOMY WITH INTRAOPERATIVE CHOLANGIOGRAM (N/A)  Patient Location: PACU  Anesthesia Type:General  Level of Consciousness: awake, alert , oriented and patient cooperative  Airway and Oxygen Therapy: Patient Spontanous Breathing and Patient connected to nasal cannula oxygen  Post-op Pain: mild  Post-op Assessment: Post-op Vital signs reviewed, Patient's Cardiovascular Status Stable, Respiratory Function Stable, Patent Airway, No signs of Nausea or vomiting and Pain level controlled              Post-op Vital Signs: Reviewed and stable  Last Vitals:  Filed Vitals:   08/06/15 1445  BP: 130/68  Pulse: 68  Temp:   Resp: 16    Complications: No apparent anesthesia complications

## 2015-08-07 MED ORDER — OXYCODONE-ACETAMINOPHEN 5-325 MG PO TABS: 1.0000 | ORAL_TABLET | ORAL | Status: AC | PRN

## 2015-08-07 NOTE — Discharge Summary (Signed)
  Patient ID: Garrett Serrano [id number] 28 y.o. 1986/08/09  Admit date: 08/05/2015  Discharge date and time: 08/07/2015  Admitting Physician: Renae Fickle Toth,MD  Discharge Physician: Ernestene Mention  Admission Diagnoses: Cholecystitis with cholelithiasis                                          Tobacco use  Discharge Diagnoses: Cholecystitis with cholelithiasis                                          Tobacco use  Operations: Procedure(s): LAPAROSCOPIC CHOLECYSTECTOMY WITH INTRAOPERATIVE CHOLANGIOGRAM  Admission Condition: fair  Discharged Condition: good  Indication for Admission: This is a 29 year old Caucasian male who has had episodes of right upper quadrant pain off and on for about a year.  The evening before admission he had a steak and cheese Milam and woke up with pain at 2 AM with nausea vomiting and pain.  This episode lasted much longer than the previous ones and he presented to the emergency department.  His pain settled down in the emergency department his abdomen became nontender.  Lab work showed slight elevation of ALT to 81, lipase normal, WBC 11,500.  Ultrasound showed a 2 cm non-mobile gallstone in the gallbladder neck with borderline wall thickness but no pericholecystic fluid or stranding.  The patient was admitted for further evaluation and management of his cholecystitis and cholelithiasis.  Hospital Course: The patient was admitted, started again on high IV hydration and IV antibiotic.  His pain resolved.  On 08/06/2015 was taken to the operating room and underwent uneventful laparoscopic cholecystectomy with cholangiogram.  The glans gram was normal.  He did well postop.  Had minimal pain.  Became ambulatory and voiding uneventfully.  Diet was advanced and he was discharged home.  On the day of discharge his abdomen was soft and minimally tender.  Wounds look good.  He was in favor of going home.  He was given a prescription for Percocet.  Discharge instructions were  printed out.  Diet and activities were discussed.  He was asked to return to the office in 2 weeks.  Consults: None  Significant Diagnostic Studies: Intraoperative cholangiogram.  Ultrasound.  Pathology.  Treatments: surgery: Laparoscopic cholecystectomy with cholangiogram  Disposition: Home  Patient Instructions:    Medication List    TAKE these medications        oxyCODONE-acetaminophen 5-325 MG per tablet  Commonly known as:  PERCOCET/ROXICET  Take 1-2 tablets by mouth every 4 (four) hours as needed for moderate pain.        Activity: No sports or heavy lifting for 2 weeks.  May shower.  Return to work light duty next week. Diet: low fat, low cholesterol diet Wound Care: none needed  Follow-up:  With [facility] in 2 weeks.  Signed: Angelia Mould. Derrell Lolling, M.D., FACS General and minimally invasive surgery Breast and Colorectal Surgery  08/07/2015, 6:37 AM

## 2015-08-07 NOTE — Discharge Planning (Signed)
AVS reviewed with pt and given pain rx. Dc'd amb to private car with all personal belongings accompanied by wife.

## 2015-08-07 NOTE — Discharge Instructions (Signed)
[facility] ______[facility] °LAPAROSCOPIC SURGERY: POST OP INSTRUCTIONS °Always review your discharge instruction sheet given to you by the facility where your surgery was performed. °IF YOU HAVE DISABILITY OR FAMILY LEAVE FORMS, YOU MUST BRING THEM TO THE OFFICE FOR PROCESSING.   °DO NOT GIVE THEM TO YOUR DOCTOR. ° °1. A prescription for pain medication may be given to you upon discharge.  Take your pain medication as prescribed, if needed.  If narcotic pain medicine is not needed, then you may take acetaminophen (Tylenol) or ibuprofen (Advil) as needed. °2. Take your usually prescribed medications unless otherwise directed. °3. If you need a refill on your pain medication, please contact your pharmacy.  They will contact our office to request authorization. Prescriptions will not be filled after 5pm or on week-ends. °4. You should follow a light diet the first few days after arrival home, such as soup and crackers, etc.  Be sure to include lots of fluids daily. °5. Most patients will experience some swelling and bruising in the area of the incisions.  Ice packs will help.  Swelling and bruising can take several days to resolve.  °6. It is common to experience some constipation if taking pain medication after surgery.  Increasing fluid intake and taking a stool softener (such as Colace) will usually help or prevent this problem from occurring.  A mild laxative (Milk of Magnesia or Miralax) should be taken according to package instructions if there are no bowel movements after 48 hours. °7. Unless discharge instructions indicate otherwise, you may remove your bandages 24-48 hours after surgery, and you may shower at that time.  You may have steri-strips (small skin tapes) in place directly over the incision.  These strips should be left on the skin for 7-10 days.  If your surgeon used skin glue on the incision, you may shower in 24 hours.  The glue will flake off over the next 2-3 weeks.  Any sutures or  staples will be removed at the office during your follow-up visit. °8. ACTIVITIES:  You may resume regular (light) daily activities beginning the next day--such as daily self-care, walking, climbing stairs--gradually increasing activities as tolerated.  You may have sexual intercourse when it is comfortable.  Refrain from any heavy lifting or straining until approved by your doctor. °a. You may drive when you are no longer taking prescription pain medication, you can comfortably wear a seatbelt, and you can safely maneuver your car and apply brakes. °b. RETURN TO WORK:  __________________________________________________________ °9. You should see your doctor in the office for a follow-up appointment approximately 2-3 weeks after your surgery.  Make sure that you call for this appointment within a day or two after you arrive home to insure a convenient appointment time. °10. OTHER INSTRUCTIONS: __________________________________________________________________________________________________________________________ __________________________________________________________________________________________________________________________ °WHEN TO CALL YOUR DOCTOR: °1. Fever over 101.0 °2. Inability to urinate °3. Continued bleeding from incision. °4. Increased pain, redness, or drainage from the incision. °5. Increasing abdominal pain ° °The clinic staff is available to answer your questions during regular business hours.  Please don’t hesitate to call and ask to speak to one of the nurses for clinical concerns.  If you have a medical emergency, go to the nearest emergency room or call 911.  A surgeon from [facility] is always on call at the hospital. °[address], [address], [place], [place]  [address] ? [address] [address], [place],    [address] °[phone] ? [phone] ? FAX [phone] °Web site:   [email or web address]  Laparoscopic Cholecystectomy, Care After Refer to this  sheet in the next few weeks. These instructions provide you with information on caring for yourself after your procedure. Your health care provider may also give you more specific instructions. Your treatment has been planned according to current medical practices, but problems sometimes occur. Call your health care provider if you have any problems or questions after your procedure. WHAT TO EXPECT AFTER THE PROCEDURE After your procedure, it is typical to have the following:  Pain at your incision sites. You will be given pain medicines to control the pain.  Mild nausea or vomiting. This should improve after the first 24 hours.  Bloating and possibly shoulder pain from the gas used during the procedure. This will improve after the first 24 hours. HOME CARE INSTRUCTIONS   Change bandages (dressings) as directed by your health care provider.  Keep the wound dry and clean. You may wash the wound gently with soap and water. Gently blot or dab the area dry.  Do not take baths or use swimming pools or hot tubs for 2 weeks or until your health care provider approves.  Only take over-the-counter or prescription medicines as directed by your health care provider.  Continue your normal diet as directed by your health care provider.  Do not lift anything heavier than 10 pounds (4.5 kg) until your health care provider approves.  Do not play contact sports for 1 week or until your health care provider approves. SEEK MEDICAL CARE IF:   You have redness, swelling, or increasing pain in the wound.  You notice yellowish-white fluid (pus) coming from the wound.  You have drainage from the wound that lasts longer than 1 day.  You notice a bad smell coming from the wound or dressing.  Your surgical cuts (incisions) break open. SEEK IMMEDIATE MEDICAL CARE IF:   You develop a rash.  You have difficulty breathing.  You have chest pain.  You have a fever.  You have increasing pain in the  shoulders (shoulder strap areas).  You have dizzy episodes or faint while standing.  You have severe abdominal pain.  You feel sick to your stomach (nauseous) or throw up (vomit) and this lasts for more than 1 day. Document Released: 11/27/2005 Document Revised: 09/17/2013 Document Reviewed: 07/09/2013 [facility] Patient Information 2015 [place], [place]. This information is not intended to replace advice given to you by your health care provider. Make sure you discuss any questions you have with your health care provider.    Call the [facility] surgery office on Monday and make an appointment to be seen in the [facility] of the weekend clinic in approximately 2 weeks.

## 2015-08-09 ENCOUNTER — Encounter (HOSPITAL_COMMUNITY): Payer: Self-pay | Admitting: General Surgery

## 2016-04-11 IMAGING — RF DG CHOLANGIOGRAM OPERATIVE
1 series · 4 of 4 positions shown · non-contrast
Comparison: Ultrasound August 05, 2015.

FLUOROSCOPY TIME:  10 seconds.

CLINICAL DATA: Cholecystitis with cholelithiasis.

EXAM:
INTRAOPERATIVE CHOLANGIOGRAM
TECHNIQUE: Cholangiographic images from the C-arm fluoroscopic device were
submitted for interpretation post-operatively. Please see the
procedural report for the amount of contrast and the fluoroscopy
time utilized.

[Series 1: run · 4 of 63 frames shown]
[frame 10/63]
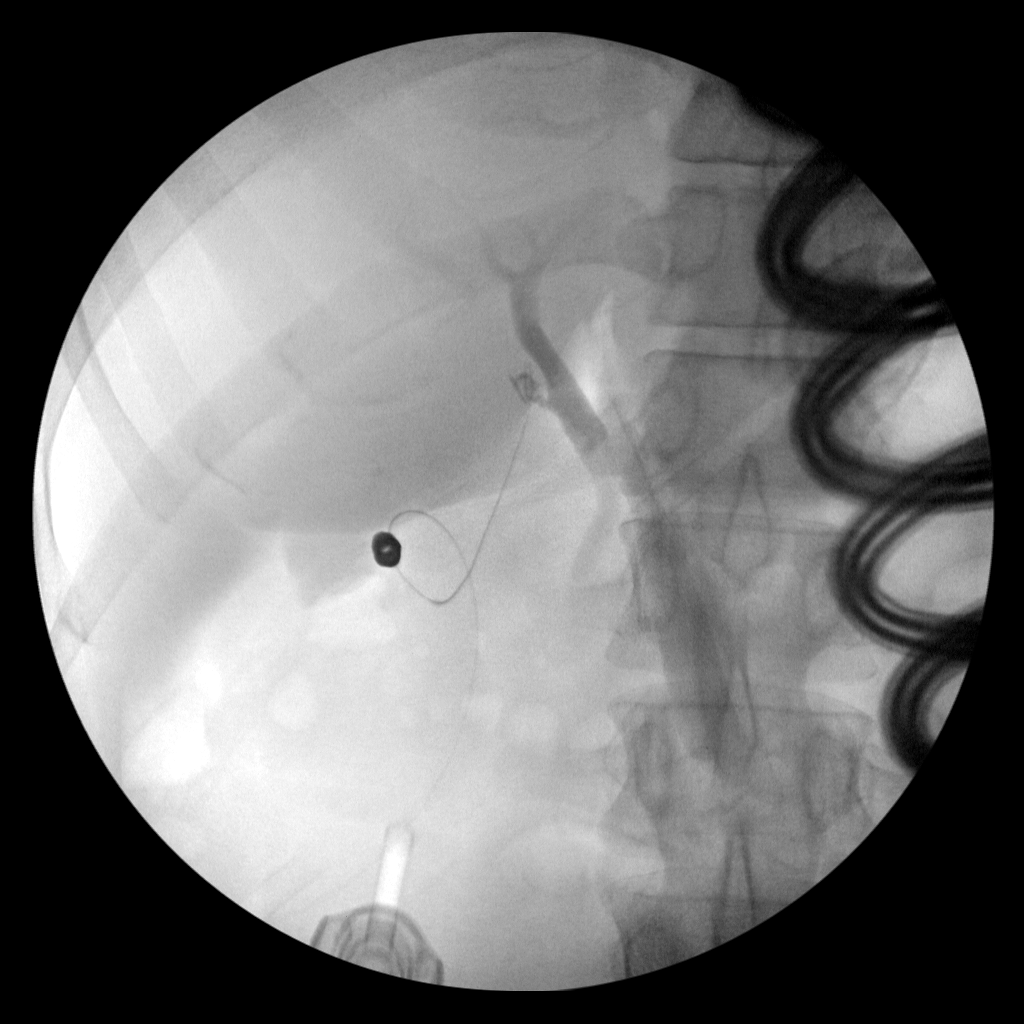
[frame 32/63]
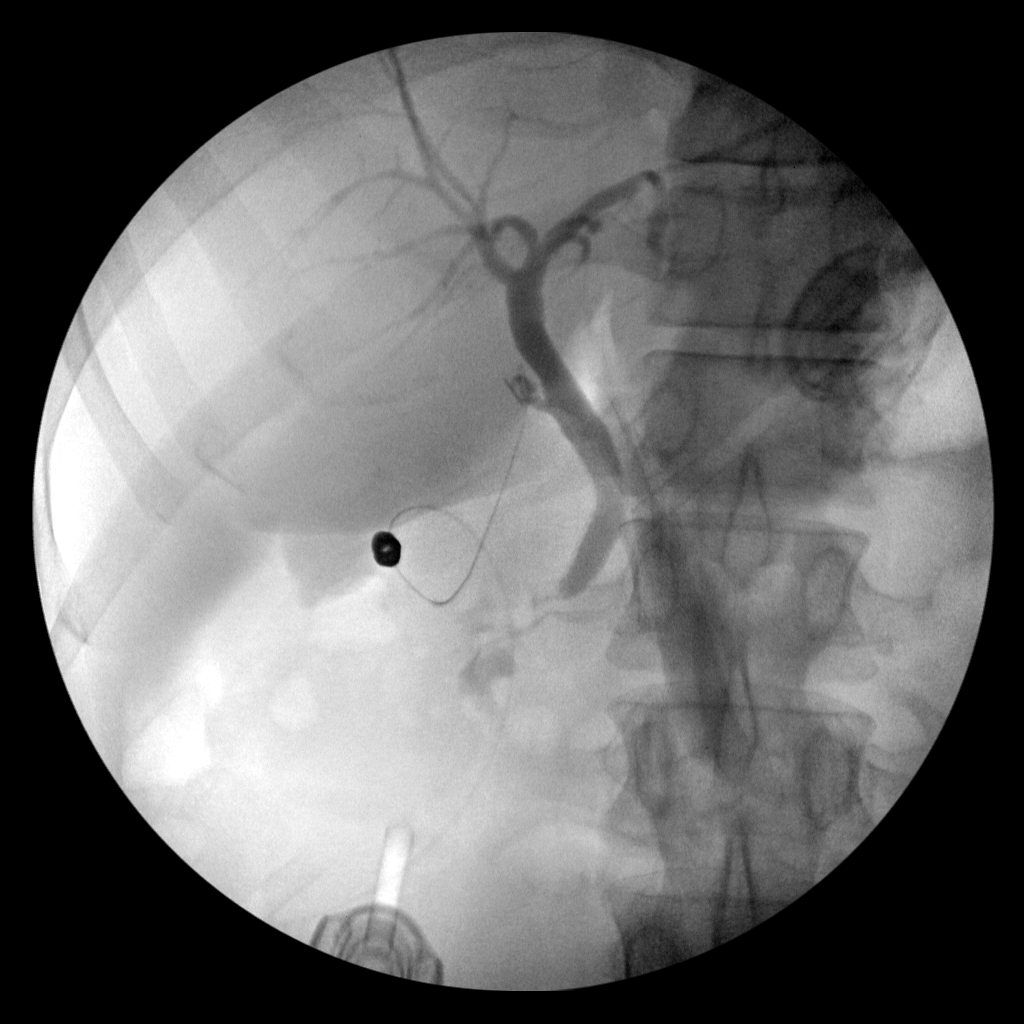
[frame 34/63]
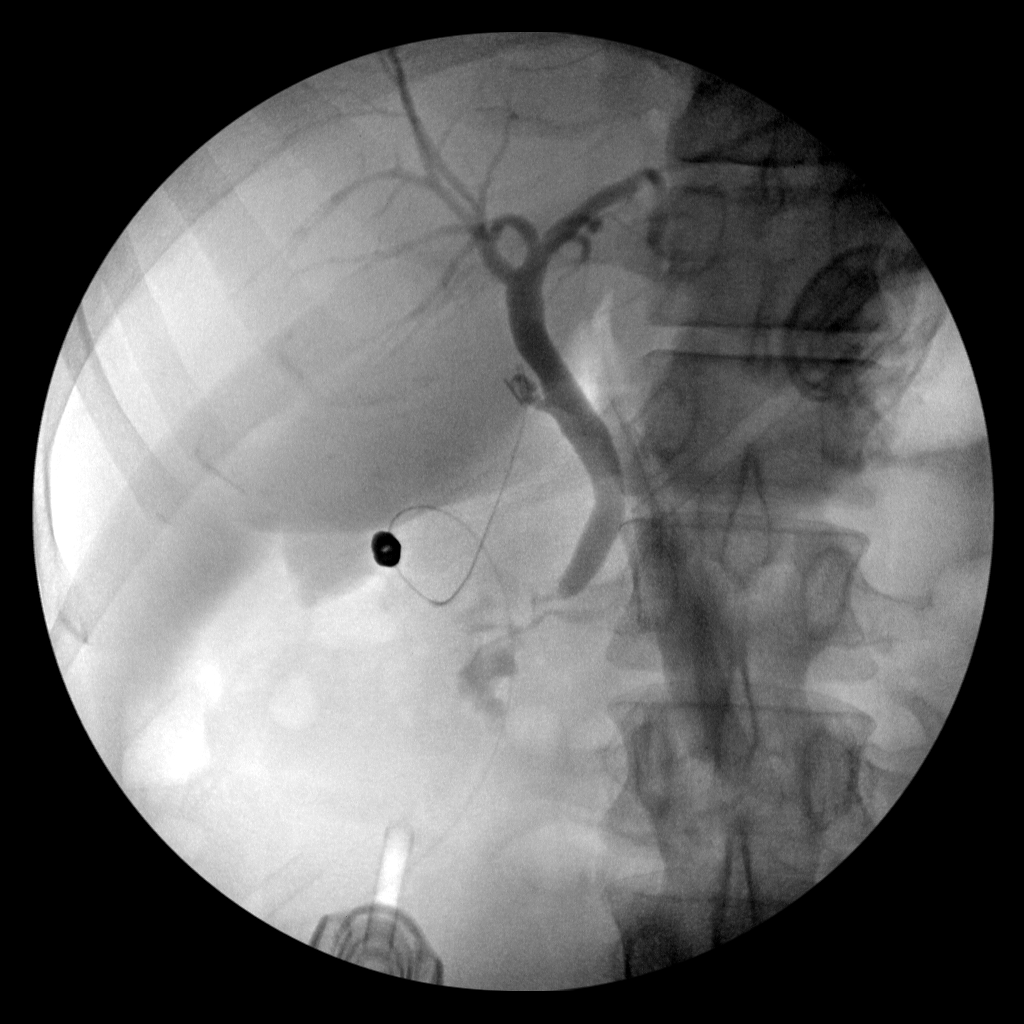
[frame 54/63]
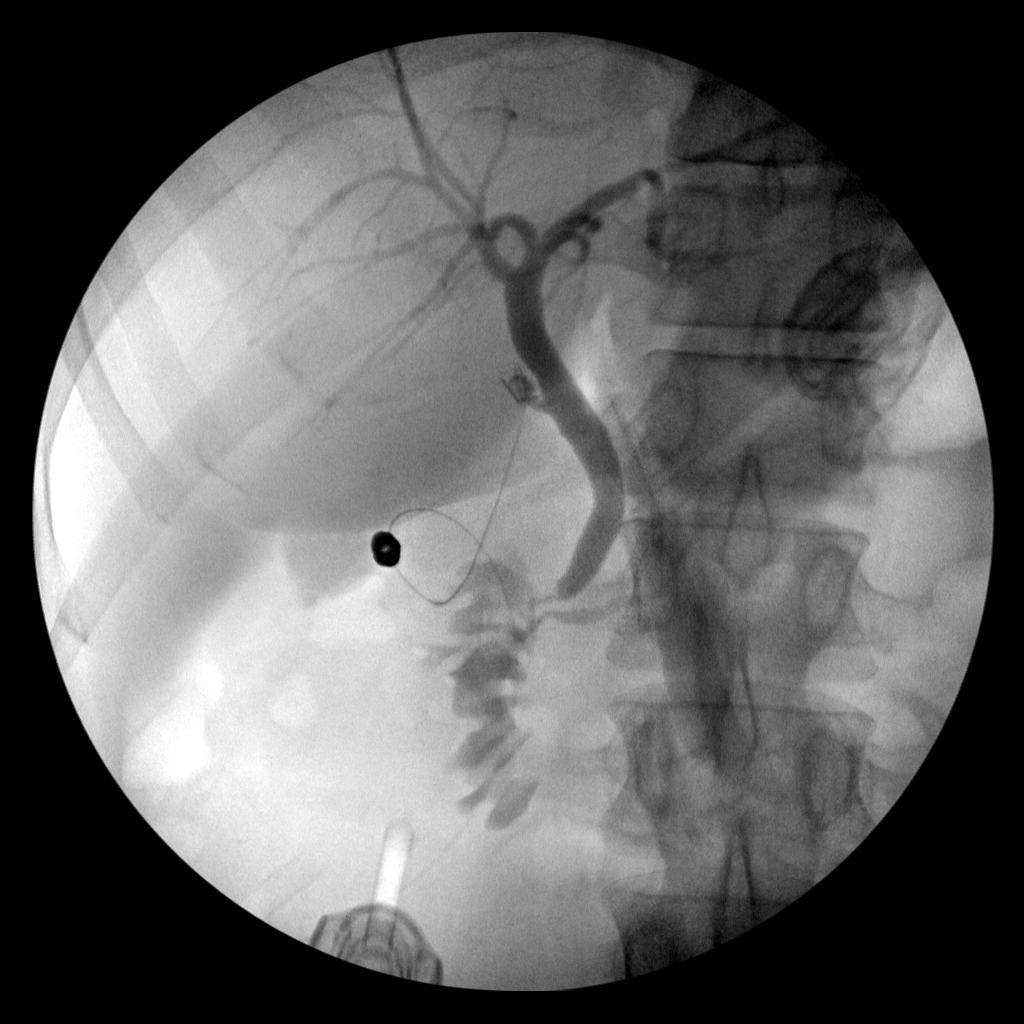

[4 of 4 positions shown; findings below may reference images not displayed]

FINDINGS: Single cine sequence demonstrates contrast injected through
cannulated cystic duct remnant. Normal filling of common bile duct
and intrahepatic ducts is noted. Antegrade flow into duodenum is
noted. No filling defect is seen to suggest retained common bile
duct stone.
IMPRESSION: No evidence of common bile duct stone seen status post
cholecystectomy.
